# Patient Record
Sex: Female | Born: 2000 | Race: Black or African American | Hispanic: No | Marital: Single | Smoking: Never smoker
Health system: Southern US, Community
[De-identification: ages and names within clinical notes are randomized; demographics above are authoritative.]

## PROBLEM LIST (undated history)

## (undated) DIAGNOSIS — A539 Syphilis, unspecified: Secondary | ICD-10-CM

## (undated) DIAGNOSIS — G43909 Migraine, unspecified, not intractable, without status migrainosus: Secondary | ICD-10-CM

## (undated) HISTORY — DX: Syphilis, unspecified: A53.9

---
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
## 2022-01-02 ENCOUNTER — Other Ambulatory Visit (HOSPITAL_COMMUNITY)
Admission: RE | Admit: 2022-01-02 | Discharge: 2022-01-02 | Disposition: A | Discharge status: Home / Self Care | Payer: Medicaid Other | Source: Ambulatory Visit | Attending: Student | Admitting: Student

## 2022-01-02 ENCOUNTER — Encounter: Payer: Self-pay | Admitting: Student

## 2022-01-02 ENCOUNTER — Ambulatory Visit (INDEPENDENT_AMBULATORY_CARE_PROVIDER_SITE_OTHER): Payer: Medicaid Other | Admitting: Student

## 2022-01-02 VITALS — BP 127/74 | HR 87 | Ht 64.0 in | Wt 168.0 lb

## 2022-01-02 DIAGNOSIS — Z30013 Encounter for initial prescription of injectable contraceptive: Secondary | ICD-10-CM

## 2022-01-02 DIAGNOSIS — Z0000 Encounter for general adult medical examination without abnormal findings: Secondary | ICD-10-CM | POA: Diagnosis present

## 2022-01-02 LAB — POCT URINE PREGNANCY: Preg Test, Ur: NEGATIVE

## 2022-01-02 MED ORDER — MEDROXYPROGESTERONE ACETATE 150 MG/ML IM SUSP
150.0000 mg | Freq: Once | INTRAMUSCULAR | Status: AC
  Administered 2022-01-02: 150 mg via INTRAMUSCULAR

## 2022-01-02 NOTE — Progress Notes (Signed)
?  History:  ?Ms. Chanta Scalera is a 21 y.o. No obstetric history on file. who presents to clinic today to establish care. She reports that her periods are irregular; they last about 5 days. She reports that they are heavy on the 1st and the third day, with occasional blood clots. She also endorses some cramping . She would like to start depo. She is having occasional brown discharge and would like STI testing.  ? ?The following portions of the patient's history were reviewed and updated as appropriate: allergies, current medications, family history, past medical history, social history, past surgical history and problem list. ? ?Review of Systems:  ?Review of Systems  ?Constitutional: Negative.   ?HENT: Negative.    ?Eyes: Negative.   ?Cardiovascular: Negative.   ?Gastrointestinal: Negative.   ?Genitourinary: Negative.   ?     + occasional brown discharge  ?Skin: Negative.   ?Neurological: Negative.   ? ?  ?Objective:  ?Physical Exam ?BP 127/74   Pulse 87   Ht 5\' 4"  (1.626 m)   Wt 168 lb (76.2 kg)   BMI 28.84 kg/m?  ?Physical Exam ?Constitutional:   ?   Appearance: Normal appearance.  ?HENT:  ?   Head: Normocephalic.  ?Cardiovascular:  ?   Rate and Rhythm: Normal rate.  ?Pulmonary:  ?   Effort: Pulmonary effort is normal.  ?Abdominal:  ?   General: Abdomen is flat.  ?Genitourinary: ?   General: Normal vulva.  ?Musculoskeletal:     ?   General: Normal range of motion.  ?Skin: ?   General: Skin is warm.  ?Neurological:  ?   General: No focal deficit present.  ?   Mental Status: She is alert.  ?Psychiatric:     ?   Mood and Affect: Mood normal.  ? ? ? ? ?Labs and Imaging ?No results found for this or any previous visit (from the past 24 hour(s)). ? ?No results found. ? ?Health Maintenance Due  ?Topic Date Due  ? HPV VACCINES (1 - 2-dose series) Never done  ? CHLAMYDIA SCREENING  Never done  ? HIV Screening  Never done  ? Hepatitis C Screening  Never done  ? ? ? ?Assessment & Plan:  ? ?1. Well woman exam (no  gynecological exam)   ?2. Encounter for initial prescription of injectable contraceptive   ? ?-patient wants STI testing and swabs; she was shown how to do self-swabs in the future ?-Phq-9 was a 7; will make referral to [facility] ?Approximately 20 minutes of total time was spent with this patient on counseling and care.  ? ?Starr Lake, CNM ?01/02/2022 ?4:19 PM ? ?

## 2022-01-02 NOTE — Progress Notes (Signed)
NGYN pt in office to establish care. Pt requesting STD testing and she is interested in Depo. Unprotected sex 1 month ago.  ?

## 2022-01-04 ENCOUNTER — Encounter: Payer: Self-pay | Admitting: Student

## 2022-01-04 LAB — HEPATITIS C ANTIBODY: Hep C Virus Ab: NONREACTIVE

## 2022-01-04 LAB — RPR, QUANT+TP ABS (REFLEX)
Rapid Plasma Reagin, Quant: 1:64 {titer} — ABNORMAL HIGH
T Pallidum Abs: REACTIVE — AB

## 2022-01-04 LAB — HIV ANTIBODY (ROUTINE TESTING W REFLEX): HIV Screen 4th Generation wRfx: NONREACTIVE

## 2022-01-04 LAB — HEPATITIS B SURFACE ANTIGEN: Hepatitis B Surface Ag: NEGATIVE

## 2022-01-04 LAB — RPR: RPR Ser Ql: REACTIVE — AB

## 2022-01-06 ENCOUNTER — Telehealth: Payer: Self-pay

## 2022-01-06 LAB — CERVICOVAGINAL ANCILLARY ONLY
Bacterial Vaginitis (gardnerella): POSITIVE — AB
Candida Glabrata: NEGATIVE
Candida Vaginitis: NEGATIVE
Chlamydia: NEGATIVE
Comment: NEGATIVE
Comment: NEGATIVE
Comment: NEGATIVE
Comment: NEGATIVE
Comment: NEGATIVE
Comment: NORMAL
Neisseria Gonorrhea: NEGATIVE
Trichomonas: NEGATIVE

## 2022-01-06 NOTE — Telephone Encounter (Signed)
Called pt in regards to MyChart message.  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  Pt agrees to be scheduled on 01/08/22 @ 1400 for 1st dose.  Pt verbalized understanding.  ? Addison Naegeli, RN  ?01/06/22 ?

## 2022-01-07 ENCOUNTER — Encounter: Payer: Self-pay | Admitting: Student

## 2022-01-07 ENCOUNTER — Telehealth: Payer: Self-pay | Admitting: Lactation Services

## 2022-01-07 DIAGNOSIS — A510 Primary genital syphilis: Secondary | ICD-10-CM | POA: Insufficient documentation

## 2022-01-07 NOTE — Progress Notes (Signed)
Pt was notified. Report faxed to [facility]. Pt scheduled at [facility] at [facility] for Bicillin 2.4 MMUS IM once a week for next 3 weeks.

## 2022-01-07 NOTE — Telephone Encounter (Signed)
-----   Message from Harrel Lemon, RN sent at 01/07/2022 11:00 AM EDT ----- ?Lorain Childes pt just called here trying to reschedule appt for a different time. I gave her address and phone number for [facility]. She says she can't get a live person. I told her to leave a message and try calling back. ?----- Message ----- ?From: Ed Blalock, RN ?Sent: 01/07/2022   9:47 AM EDT ?To: Harrel Lemon, RN ? ?Denny Peon,  ? ?Bicillin 1.2 million Units in each hip weekly x 3. Is there a reason she is scheduled her instead of your office?  ? ?Thanks ?Jasmine December, RN ?----- Message ----- ?From: Harrel Lemon, RN ?Sent: 01/07/2022   8:40 AM EDT ?To: [facility] ? 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 I see that the patient is scheduled 01/08/22 for 1st dose treatment. I can't seem to find orders so that I can write exactly what med and dose and number of treatments on the form. Thanks in advance for checking on this for me! ? ? ? ?

## 2022-01-07 NOTE — Telephone Encounter (Signed)
Called patient back and she reports she works 7-4:30 and needs to come in around 11 since that is her break time. Appointment change to accommodate patient.  ?

## 2022-01-08 ENCOUNTER — Encounter: Payer: Self-pay | Admitting: Student

## 2022-01-08 ENCOUNTER — Ambulatory Visit (INDEPENDENT_AMBULATORY_CARE_PROVIDER_SITE_OTHER): Payer: Medicaid Other

## 2022-01-08 VITALS — BP 137/73 | HR 97 | Wt 164.6 lb

## 2022-01-08 DIAGNOSIS — A539 Syphilis, unspecified: Secondary | ICD-10-CM

## 2022-01-08 MED ORDER — PENICILLIN G BENZATHINE 1200000 UNIT/2ML IM SUSY
2.4000 10*6.[IU] | PREFILLED_SYRINGE | Freq: Once | INTRAMUSCULAR | Status: AC
  Administered 2022-01-08: 2.4 10*6.[IU] via INTRAMUSCULAR

## 2022-01-08 NOTE — Progress Notes (Signed)
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 Patient received first Bicillin dose without issue. Patient aware she is to receive a total of 3 doses of Bicillin once a week for 3 weeks. Patient verbalized understanding and denies any questions.   Alesia Richards, RN 01/08/22

## 2022-01-15 ENCOUNTER — Ambulatory Visit (INDEPENDENT_AMBULATORY_CARE_PROVIDER_SITE_OTHER): Payer: Medicaid Other | Admitting: General Practice

## 2022-01-15 VITALS — BP 138/80 | HR 96 | Ht 64.0 in | Wt 166.0 lb

## 2022-01-15 DIAGNOSIS — A510 Primary genital syphilis: Secondary | ICD-10-CM

## 2022-01-15 MED ORDER — PENICILLIN G BENZATHINE 1200000 UNIT/2ML IM SUSY
2.4000 10*6.[IU] | PREFILLED_SYRINGE | Freq: Once | INTRAMUSCULAR | Status: AC
  Administered 2022-01-15: 2.4 10*6.[IU] via INTRAMUSCULAR

## 2022-01-15 NOTE — Progress Notes (Signed)
[place] here for  Bicillin   Injection.  Injection administered without complication. Patient will return in one week for next injection.  Marylynn Pearson, RN 01/15/2022  10:03 AM

## 2022-01-22 ENCOUNTER — Ambulatory Visit (INDEPENDENT_AMBULATORY_CARE_PROVIDER_SITE_OTHER): Payer: Medicaid Other

## 2022-01-22 DIAGNOSIS — A510 Primary genital syphilis: Secondary | ICD-10-CM

## 2022-01-22 MED ORDER — PENICILLIN G BENZATHINE 1200000 UNIT/2ML IM SUSY
1.2000 10*6.[IU] | PREFILLED_SYRINGE | Freq: Once | INTRAMUSCULAR | Status: AC
  Administered 2022-01-22: 1.2 10*6.[IU] via INTRAMUSCULAR

## 2022-01-22 NOTE — Progress Notes (Signed)
Pt here today for 3rd dose of Bicillin for RPR treatment.  Administered in left/right upper quadrants.  Pt tolerated well.  Pt advised to call [facility] and let them know that she has had her third dose and request there recommendation for f/u.  Pt verbalized understanding with no further questions.    Leonette Nutting  01/22/22

## 2022-02-01 ENCOUNTER — Encounter: Payer: Self-pay | Admitting: Student

## 2022-04-23 ENCOUNTER — Ambulatory Visit (INDEPENDENT_AMBULATORY_CARE_PROVIDER_SITE_OTHER): Payer: Medicaid Other | Admitting: Emergency Medicine

## 2022-04-23 VITALS — BP 124/82 | HR 81 | Ht 64.0 in | Wt 175.0 lb

## 2022-04-23 DIAGNOSIS — Z3042 Encounter for surveillance of injectable contraceptive: Secondary | ICD-10-CM | POA: Diagnosis not present

## 2022-04-23 DIAGNOSIS — A539 Syphilis, unspecified: Secondary | ICD-10-CM

## 2022-04-23 LAB — POCT URINE PREGNANCY: Preg Test, Ur: NEGATIVE

## 2022-04-23 MED ORDER — MEDROXYPROGESTERONE ACETATE 150 MG/ML IM SUSP
150.0000 mg | Freq: Once | INTRAMUSCULAR | Status: AC
  Administered 2022-04-23: 150 mg via INTRAMUSCULAR

## 2022-04-23 MED ORDER — MEDROXYPROGESTERONE ACETATE 150 MG/ML IM SUSP: 150.0000 mg | INTRAMUSCULAR | 0 refills | Status: DC

## 2022-04-23 NOTE — Progress Notes (Signed)
Date last pap: NA. Last Depo-Provera: 01/02/2022. Side Effects if any: Irregular bleeding. Serum HCG indicated? Yes, 20 days outside of window. Per Dr. Alysia Penna, if pregnacy test is negative, pt may receive dose. Depo-Provera 150 mg IM given by: Leavy Cella, RN into LEFT arm, tolerated well. Next appointment due Nov 15-Nov 29.   3 month RPR drawn today.  UPT negative in office, Depo given.

## 2022-04-25 ENCOUNTER — Encounter: Payer: Self-pay | Admitting: Student

## 2022-04-25 LAB — RPR: RPR Ser Ql: REACTIVE — AB

## 2022-04-25 LAB — RPR, QUANT+TP ABS (REFLEX)
Rapid Plasma Reagin, Quant: 1:8 {titer} — ABNORMAL HIGH
T Pallidum Abs: REACTIVE — AB

## 2022-04-29 ENCOUNTER — Encounter: Payer: Self-pay | Admitting: Student

## 2022-05-22 ENCOUNTER — Ambulatory Visit
Admission: EM | Admit: 2022-05-22 | Discharge: 2022-05-22 | Disposition: A | Discharge status: Home / Self Care | Payer: Medicaid Other | Attending: Internal Medicine | Admitting: Internal Medicine

## 2022-05-22 DIAGNOSIS — G43019 Migraine without aura, intractable, without status migrainosus: Secondary | ICD-10-CM

## 2022-05-22 MED ORDER — KETOROLAC TROMETHAMINE 30 MG/ML IJ SOLN
30.0000 mg | Freq: Once | INTRAMUSCULAR | Status: AC
  Administered 2022-05-22: 30 mg via INTRAMUSCULAR

## 2022-05-22 MED ORDER — DEXAMETHASONE SODIUM PHOSPHATE 10 MG/ML IJ SOLN
10.0000 mg | Freq: Once | INTRAMUSCULAR | Status: AC
  Administered 2022-05-22: 10 mg via INTRAMUSCULAR

## 2022-05-22 NOTE — ED Triage Notes (Signed)
Pt c/o frontal headache x2wks. States taking tylenol, Advil, and ibuprofen with no relief. Took tbuprofen 200mg  around noon.

## 2022-05-22 NOTE — ED Provider Notes (Signed)
[facility]    CSN: [id number] Arrival date & time: 05/22/22  1648      History   Chief Complaint Chief Complaint  Patient presents with   Headache    HPI Karen Cherry is a 21 y.o. female.   Patient presents with migraine headache that has been present constantly for about 2 weeks.  Patient reports headache is present in the bilateral sides of the head and in the frontal portion of the head.  Patient states that this feels typical for her migraines in the past.  Patient has taken Tylenol, Advil, ibuprofen with no improvement.  Last took 200 mg of ibuprofen about 6 hours prior to arrival to urgent care.  Denies dizziness, blurred vision, nausea, vomiting.  Denies any recent head traumas or falls.  Patient reports that she has a history of migraines but has not had one in about 4 years.  She was prescribed a "migraine medication" by PCP that she no longer takes.  She is not sure the name of it.  Patient states that her migraines typically resolve with over-the-counter medications but has not this time.   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 No pertinent surgical history.  OB History   No obstetric history on file.      Home Medications    Prior to Admission medications   Medication Sig Start Date End Date Taking? Authorizing Provider  medroxyPROGESTERone (DEPO-PROVERA) 150 MG/ML injection Inject 150 mg into the muscle every 3 (three) months.    [provider]  medroxyPROGESTERone (DEPO-PROVERA) 150 MG/ML injection Inject 1 mL (150 mg total) into the muscle every 3 (three) months. 04/23/22   Chancy Milroy, MD    Family History Family History  Problem Relation Age of Onset   Hypertension Mother    Diabetes Maternal Grandmother    Diabetes Maternal Grandfather     Social History Social History    Tobacco Use   Smoking status: Never  Vaping Use   Vaping Use: Never used  Substance Use Topics   Alcohol use: Not Currently   Drug use: Not Currently     Allergies   Patient has no known allergies.   Review of Systems Review of Systems Per HPI  Physical Exam Triage Vital Signs ED Triage Vitals [05/22/22 1714]  Enc Vitals Group     BP 124/75     Pulse Rate 84     Resp 18     Temp 99.4 F (37.4 C)     Temp Source Oral     SpO2 98 %     Weight      Height      Head Circumference      Peak Flow      Pain Score 7     Pain Loc      Pain Edu?      Excl. in [place]?    No data found.  Updated Vital Signs BP 124/75 (BP Location: Left Arm)   Pulse 84   Temp 99.4 F (37.4 C) (Oral)   Resp 18   LMP  (LMP Unknown) Comment: irregular bleeding  SpO2 98%   Visual Acuity Right Eye Distance:   Left Eye Distance:   Bilateral Distance:    Right Eye Near:   Left Eye Near:  Bilateral Near:     Physical Exam Constitutional:      General: She is not in acute distress.    Appearance: Normal appearance. She is not toxic-appearing or diaphoretic.  HENT:     Head: Normocephalic and atraumatic.  Eyes:     Extraocular Movements: Extraocular movements intact.     Conjunctiva/sclera: Conjunctivae normal.     Pupils: Pupils are equal, round, and reactive to light.  Cardiovascular:     Rate and Rhythm: Normal rate and regular rhythm.     Pulses: Normal pulses.     Heart sounds: Normal heart sounds.  Pulmonary:     Effort: Pulmonary effort is normal. No respiratory distress.     Breath sounds: Normal breath sounds.  Neurological:     General: No focal deficit present.     Mental Status: She is alert and oriented to person, place, and time. Mental status is at baseline.     Cranial Nerves: Cranial nerves 2-12 are intact.     Sensory: Sensation is intact.     Motor: Motor function is intact.     Coordination: Coordination is intact.     Gait: Gait is intact.   Psychiatric:        Mood and Affect: Mood normal.        Behavior: Behavior normal.        Thought Content: Thought content normal.        Judgment: Judgment normal.      UC Treatments / Results  Labs (all labs ordered are listed, but only abnormal results are displayed) Labs Reviewed - No data to display  EKG   Radiology No results found.  Procedures Procedures (including critical care time)  Medications Ordered in UC Medications  ketorolac (TORADOL) 30 MG/ML injection 30 mg (30 mg Intramuscular Given 05/22/22 1752)  dexamethasone (DECADRON) injection 10 mg (10 mg Intramuscular Given 05/22/22 1751)    Initial Impression / Assessment and Plan / UC Course  I have reviewed the triage vital signs and the nursing notes.  Pertinent labs & imaging results that were available during my care of the patient were reviewed by me and considered in my medical decision making (see chart for details).     Patient has migraine headache and states that this feels similar to migraines in the past.  Neuro exam is also normal so do not think that any emergent evaluation or imaging of the head is necessary at this time.  Administered migraine cocktail in urgent care today.  Advised to avoid NSAIDs for at least 24 hours following injections.  Patient advised to go to the emergency department if symptoms persist or worsen the next 24 to 48 hours.  Patient verbalized understanding and was agreeable with plan. Final Clinical Impressions(s) / UC Diagnoses   Final diagnoses:  Intractable migraine without aura and without status migrainosus     Discharge Instructions      You were given migraine cocktail in urgent care today.  Please avoid any ibuprofen, Advil, Aleve for least 24 hours following injections.  Please go to the emergency department if symptoms persist or worsen in 24 to 48 hours.    ED Prescriptions   None    PDMP not reviewed this encounter.   Gustavus Bryant, [place] 05/22/22  [phone]

## 2022-05-22 NOTE — Discharge Instructions (Signed)
You were given migraine cocktail in urgent care today.  Please avoid any ibuprofen, Advil, Aleve for least 24 hours following injections.  Please go to the emergency department if symptoms persist or worsen in 24 to 48 hours.

## 2022-07-11 ENCOUNTER — Ambulatory Visit (INDEPENDENT_AMBULATORY_CARE_PROVIDER_SITE_OTHER): Payer: Medicaid Other | Admitting: General Practice

## 2022-07-11 VITALS — BP 124/82 | HR 87 | Ht 64.0 in | Wt 180.7 lb

## 2022-07-11 DIAGNOSIS — Z3042 Encounter for surveillance of injectable contraceptive: Secondary | ICD-10-CM | POA: Diagnosis not present

## 2022-07-11 MED ORDER — MEDROXYPROGESTERONE ACETATE 150 MG/ML IM SUSP: 150.0000 mg | INTRAMUSCULAR | 1 refills | Status: DC

## 2022-07-11 MED ORDER — MEDROXYPROGESTERONE ACETATE 150 MG/ML IM SUSP
150.0000 mg | Freq: Once | INTRAMUSCULAR | Status: AC
  Administered 2022-07-11: 150 mg via INTRAMUSCULAR

## 2022-07-11 NOTE — Progress Notes (Signed)
Patient was assessed and managed by nursing staff during this encounter. I have reviewed the chart and agree with the documentation and plan. I have also made any necessary editorial changes.  Catalina Antigua, MD 07/11/2022 11:53 AM

## 2022-07-11 NOTE — Progress Notes (Signed)
Date last pap: NA. Last Depo-Provera: 04-23-22. Side Effects if any: Pt tolerated well. Serum HCG indicated? Depo given on schedule. Depo-Provera 150 mg IM given by: Hope Pigeon, CMA in the LD per pt request. Next appointment due 2/2-2/16.

## 2022-09-29 ENCOUNTER — Ambulatory Visit (INDEPENDENT_AMBULATORY_CARE_PROVIDER_SITE_OTHER): Payer: Medicaid Other

## 2022-09-29 VITALS — BP 125/76 | HR 90 | Wt 186.0 lb

## 2022-09-29 DIAGNOSIS — Z3042 Encounter for surveillance of injectable contraceptive: Secondary | ICD-10-CM | POA: Diagnosis not present

## 2022-09-29 MED ORDER — MEDROXYPROGESTERONE ACETATE 150 MG/ML IM SUSP
150.0000 mg | Freq: Once | INTRAMUSCULAR | Status: AC
  Administered 2022-09-29: 150 mg via INTRAMUSCULAR

## 2022-09-29 NOTE — Progress Notes (Signed)
Subjective:  Pt in for Depo Provera injection. Used pt's supply   Objective: Need for contraception. No unusual complaints.    Assessment: Depo given L Del without difficulty   Plan:  Next injection due Apr 23-May 7.

## 2022-12-16 ENCOUNTER — Ambulatory Visit (INDEPENDENT_AMBULATORY_CARE_PROVIDER_SITE_OTHER): Payer: Medicaid Other | Admitting: *Deleted

## 2022-12-16 VITALS — BP 127/81 | HR 91

## 2022-12-16 DIAGNOSIS — Z3042 Encounter for surveillance of injectable contraceptive: Secondary | ICD-10-CM

## 2022-12-16 MED ORDER — MEDROXYPROGESTERONE ACETATE 150 MG/ML IM SUSP: 150.0000 mg | INTRAMUSCULAR | 0 refills | Status: DC

## 2022-12-16 MED ORDER — MEDROXYPROGESTERONE ACETATE 150 MG/ML IM SUSP
150.0000 mg | Freq: Once | INTRAMUSCULAR | Status: AC
  Administered 2022-12-16: 150 mg via INTRAMUSCULAR

## 2022-12-16 NOTE — Progress Notes (Addendum)
Date last pap: NA/ Annual due after 01/03/23 Last Depo-Provera: 09/29/22. Side Effects if any: NA. Serum HCG indicated? : NA. Depo-Provera 150 mg IM given by: Montez Morita, RN in . Next appointment due 03/03/23-03/17/23.  Pt advised 1 refill to be sent today (annual RX was not sent on 01/02/22). Annual scheduled at check out.

## 2023-02-02 ENCOUNTER — Encounter: Payer: Self-pay | Admitting: Obstetrics and Gynecology

## 2023-02-02 ENCOUNTER — Ambulatory Visit (INDEPENDENT_AMBULATORY_CARE_PROVIDER_SITE_OTHER): Payer: Medicaid Other | Admitting: Obstetrics and Gynecology

## 2023-02-02 VITALS — BP 133/75 | HR 85 | Ht 65.0 in | Wt 193.8 lb

## 2023-02-02 DIAGNOSIS — Z3042 Encounter for surveillance of injectable contraceptive: Secondary | ICD-10-CM

## 2023-02-02 DIAGNOSIS — Z0000 Encounter for general adult medical examination without abnormal findings: Secondary | ICD-10-CM | POA: Diagnosis not present

## 2023-02-02 MED ORDER — MEDROXYPROGESTERONE ACETATE 150 MG/ML IM SUSP: 150.0000 mg | INTRAMUSCULAR | 3 refills | Status: DC

## 2023-02-02 NOTE — Patient Instructions (Signed)
[email or web address]

## 2023-02-02 NOTE — Progress Notes (Signed)
Pt presents for AEX. Pt requesting refill of Depo.

## 2023-02-02 NOTE — Progress Notes (Signed)
ANNUAL EXAM Patient name: Karen Cherry MRN [id number]  Date of birth: [birth year] Chief Complaint:   No chief complaint on file.  History of Present Illness:   Karen Cherry is a 22 y.o. No obstetric history on file.  female being seen today for a routine annual exam.  Current complaints: weight gain with depo  No LMP recorded. Patient has had an injection.   Upstream - 02/02/23 1316       Pregnancy Intention Screening   Does the patient want to become pregnant in the next year? No    Does the patient's partner want to become pregnant in the next year? No    Would the patient like to discuss contraceptive options today? Yes            The pregnancy intention screening data noted above was reviewed. Potential methods of contraception were discussed. The patient elected to proceed with No data recorded.   Last pap n/a. Results were: N/A. Not of age Last mammogram: n/a. Results were: N/A. Family h/o breast cancer: no Last colonoscopy: n/a. Results were: N/A. Family h/o colorectal cancer: no     02/02/2023    1:14 PM 01/02/2022    3:53 PM  Depression screen PHQ 2/9  Decreased Interest 0 0  Down, Depressed, Hopeless 1 1  PHQ - 2 Score 1 1  Altered sleeping 0 0  Tired, decreased energy 1 0  Change in appetite 3 3  Feeling bad or failure about yourself  2 3  Trouble concentrating 0 0  Moving slowly or fidgety/restless 0 0  Suicidal thoughts 0 0  PHQ-9 Score 7 7  Difficult doing work/chores  Somewhat difficult        02/02/2023    1:15 PM 01/02/2022    3:54 PM  GAD 7 : Generalized Anxiety Score  Nervous, Anxious, on Edge 2 0  Control/stop worrying 0 0  Worry too much - different things 1 2  Trouble relaxing 0 0  Restless 0 0  Easily annoyed or irritable 3 3  Afraid - awful might happen 0 0  Total GAD 7 Score 6 5  Anxiety Difficulty  Somewhat difficult     Review of Systems:   Pertinent items are noted in HPI Denies any headaches, blurred vision, fatigue,  shortness of breath, chest pain, abdominal pain, abnormal vaginal discharge/itching/odor/irritation, problems with periods, bowel movements, urination, or intercourse unless otherwise stated above. Pertinent History Reviewed:  Reviewed past medical,surgical, social and family history.  Reviewed problem list, medications and allergies. Physical Assessment:   Vitals:   02/02/23 1308  BP: 133/75  Pulse: 85  Weight: 193 lb 12.8 oz (87.9 kg)  Height: 5\' 5"  (1.651 m)  Body mass index is 32.25 kg/m.        Physical Examination:   General appearance - well appearing, and in no distress  Mental status - alert, oriented to person, place, and time  Psych:  She has a normal mood and affect  Skin - warm and dry, normal color, no suspicious lesions noted  Chest - effort normal, all lung fields clear to auscultation bilaterally  Heart - normal rate and regular rhythm  Neck:  midline trachea, no thyromegaly or nodules  Breasts - breasts appear normal, no suspicious masses, no skin or nipple changes or  axillary nodes  Abdomen - soft, nontender, nondistended, no masses or organomegaly  Pelvic - deferred  Extremities:  No swelling or varicosities noted  Chaperone present for exam  No results  found for this or any previous visit (from the past 24 hour(s)).  Assessment & Plan:  1. Encounter for surveillance of injectable contraceptive Would like to continue depo for now, rx sent follow up in July   2. Well woman exam without gynecological exam Pap next year, discussed other options of BC, will let us know if she decides to change     Mammogram: @ 20yo, or sooner if problems Colonoscopy: @ 20yo, or sooner if problems   Follow-up: One year annual well woman exam, 7/15 for depo provera injection  Future Appointments  Date Time Provider Department Center  03/09/2023  1:20 PM [facility] NURSE [facility] None    Albertine Grates, FNP

## 2023-03-09 ENCOUNTER — Ambulatory Visit: Payer: Medicaid Other | Admitting: *Deleted

## 2023-03-09 VITALS — BP 121/73 | HR 84

## 2023-03-09 DIAGNOSIS — Z3042 Encounter for surveillance of injectable contraceptive: Secondary | ICD-10-CM | POA: Diagnosis not present

## 2023-03-09 MED ORDER — MEDROXYPROGESTERONE ACETATE 150 MG/ML IM SUSP
150.0000 mg | Freq: Once | INTRAMUSCULAR | Status: AC
  Administered 2023-03-09: 150 mg via INTRAMUSCULAR

## 2023-03-09 NOTE — Progress Notes (Signed)
Date last pap: NA (Annual 02/02/23. Last Depo-Provera: 12/16/22. Side Effects if any: Weight gain. Serum HCG indicated? NA. Depo-Provera 150 mg IM given by: Montez Morita, RN. Next appointment due 05/25/23-06/08/23.

## 2023-06-04 ENCOUNTER — Ambulatory Visit: Payer: Medicaid Other

## 2023-06-04 VITALS — BP 127/79 | HR 93 | Wt 201.2 lb

## 2023-06-04 DIAGNOSIS — Z3042 Encounter for surveillance of injectable contraceptive: Secondary | ICD-10-CM

## 2023-06-04 MED ORDER — MEDROXYPROGESTERONE ACETATE 150 MG/ML IM SUSP
150.0000 mg | INTRAMUSCULAR | Status: DC
  Administered 2023-06-04: 150 mg via INTRAMUSCULAR

## 2023-06-04 NOTE — Progress Notes (Signed)
Pt is in the office for depo injection. Administered in L Del, per pt request, and pt tolerated well. Next due Dec 26- Jan 9 .. Administrations This Visit     medroxyPROGESTERone (DEPO-PROVERA) injection 150 mg     Admin Date 06/04/2023 Action Given Dose 150 mg Route Intramuscular Documented By Katrina Stack, RN

## 2023-07-07 ENCOUNTER — Ambulatory Visit
Admission: EM | Admit: 2023-07-07 | Discharge: 2023-07-07 | Disposition: A | Discharge status: Home / Self Care | Payer: Medicaid Other | Attending: Internal Medicine | Admitting: Internal Medicine

## 2023-07-07 DIAGNOSIS — J208 Acute bronchitis due to other specified organisms: Secondary | ICD-10-CM | POA: Diagnosis not present

## 2023-07-07 DIAGNOSIS — J029 Acute pharyngitis, unspecified: Secondary | ICD-10-CM

## 2023-07-07 LAB — POCT RAPID STREP A (OFFICE): Rapid Strep A Screen: NEGATIVE

## 2023-07-07 MED ORDER — BENZONATATE 100 MG PO CAPS: 100.0000 mg | ORAL_CAPSULE | Freq: Three times a day (TID) | ORAL | 0 refills | Status: AC | PRN

## 2023-07-07 MED ORDER — PREDNISONE 20 MG PO TABS: 40.0000 mg | ORAL_TABLET | Freq: Every day | ORAL | 0 refills | Status: AC

## 2023-07-07 NOTE — Discharge Instructions (Signed)
I have prescribed you a steroid and a cough medication due to acute viral illness.  Follow-up if any symptoms persist or worsen.

## 2023-07-07 NOTE — ED Triage Notes (Signed)
Patient presents with sore throat, cough, headache and states chest hurts from coughing x day 6. Treated with Mucinex without relief.

## 2023-07-07 NOTE — ED Provider Notes (Signed)
[facility]    CSN: [id number] Arrival date & time: 07/07/23  1557      History   Chief Complaint No chief complaint on file.   HPI Karen Cherry is a 22 y.o. female.   Patient presents with approximately 6-day history of sore throat, nasal congestion, cough.  Reports that she has chest discomfort when she coughs.  Denies any shortness of breath.  Denies any fever but does report body aches and chills.  Has taken Mucinex with no improvement.  Denies any known sick contacts.  Denies history of asthma.     Past Medical History:  Diagnosis Date   Migraines    Syphilis     There are no problems to display for this patient.   History reviewed. No pertinent surgical history.  OB History     Gravida  0   Para  0   Term  0   Preterm  0   AB  0   Living  0      SAB  0   IAB  0   Ectopic  0   Multiple  0   Live Births  0            Home Medications    Prior to Admission medications   Medication Sig Start Date End Date Taking? Authorizing Provider  benzonatate (TESSALON) 100 MG capsule Take 1 capsule (100 mg total) by mouth every 8 (eight) hours as needed for cough. 07/07/23  Yes Griffey Nicasio, Rolly Salter E, FNP  predniSONE (DELTASONE) 20 MG tablet Take 2 tablets (40 mg total) by mouth daily for 5 days. 07/07/23 07/12/23 Yes Cheng Dec, Acie Fredrickson, FNP  medroxyPROGESTERone (DEPO-PROVERA) 150 MG/ML injection Inject 150 mg into the muscle every 3 (three) months.    [provider]  medroxyPROGESTERone (DEPO-PROVERA) 150 MG/ML injection Inject 1 mL (150 mg total) into the muscle every 3 (three) months. 12/16/22   Warden Fillers, MD  medroxyPROGESTERone (DEPO-PROVERA) 150 MG/ML injection Inject 1 mL (150 mg total) into the muscle every 3 (three) months. 02/02/23   Sue Lush, FNP    Family History Family History  Problem Relation Age of Onset   Hypertension Mother    Diabetes Maternal Grandmother    Diabetes Maternal Grandfather     Social  History Social History   Tobacco Use   Smoking status: Never    Passive exposure: Never  Vaping Use   Vaping status: Never Used  Substance Use Topics   Alcohol use: Not Currently   Drug use: Not Currently     Allergies   Patient has no known allergies.   Review of Systems Review of Systems Per HPI  Physical Exam Triage Vital Signs ED Triage Vitals  Encounter Vitals Group     BP 07/07/23 1633 132/82     Systolic BP Percentile --      Diastolic BP Percentile --      Pulse Rate 07/07/23 1633 83     Resp 07/07/23 1633 20     Temp 07/07/23 1633 98.6 F (37 C)     Temp Source 07/07/23 1633 Oral     SpO2 07/07/23 1633 100 %     Weight 07/07/23 1631 205 lb (93 kg)     Height 07/07/23 1631 5\' 4"  (1.626 m)     Head Circumference --      Peak Flow --      Pain Score 07/07/23 1631 6     Pain Loc --  Pain Education --      Exclude from Growth Chart --    No data found.  Updated Vital Signs BP 132/82 (BP Location: Left Arm)   Pulse 83   Temp 98.6 F (37 C) (Oral)   Resp 20   Ht 5\' 4"  (1.626 m)   Wt 205 lb (93 kg)   SpO2 100%   BMI 35.19 kg/m   Visual Acuity Right Eye Distance:   Left Eye Distance:   Bilateral Distance:    Right Eye Near:   Left Eye Near:    Bilateral Near:     Physical Exam Constitutional:      General: She is not in acute distress.    Appearance: Normal appearance. She is not toxic-appearing or diaphoretic.  HENT:     Head: Normocephalic and atraumatic.     Right Ear: Tympanic membrane and ear canal normal.     Left Ear: Tympanic membrane and ear canal normal.     Nose: Congestion present.     Mouth/Throat:     Mouth: Mucous membranes are moist.     Pharynx: Posterior oropharyngeal erythema present.  Eyes:     Extraocular Movements: Extraocular movements intact.     Conjunctiva/sclera: Conjunctivae normal.     Pupils: Pupils are equal, round, and reactive to light.  Cardiovascular:     Rate and Rhythm: Normal rate and regular  rhythm.     Pulses: Normal pulses.     Heart sounds: Normal heart sounds.  Pulmonary:     Effort: Pulmonary effort is normal. No respiratory distress.     Breath sounds: Normal breath sounds. No stridor. No wheezing, rhonchi or rales.  Abdominal:     General: Abdomen is flat. Bowel sounds are normal.     Palpations: Abdomen is soft.  Musculoskeletal:        General: Normal range of motion.     Cervical back: Normal range of motion.  Skin:    General: Skin is warm and dry.  Neurological:     General: No focal deficit present.     Mental Status: She is alert and oriented to person, place, and time. Mental status is at baseline.  Psychiatric:        Mood and Affect: Mood normal.        Behavior: Behavior normal.      UC Treatments / Results  Labs (all labs ordered are listed, but only abnormal results are displayed) Labs Reviewed  CULTURE, GROUP A STREP [facility])  POCT RAPID STREP A (OFFICE)    EKG   Radiology No results found.  Procedures Procedures (including critical care time)  Medications Ordered in UC Medications - No data to display  Initial Impression / Assessment and Plan / UC Course  I have reviewed the triage vital signs and the nursing notes.  Pertinent labs & imaging results that were available during my care of the patient were reviewed by me and considered in my medical decision making (see chart for details).     Suspect possible acute viral bronchitis.  There are no adventitious lung sounds on exam so do not think that chest imaging is necessary.  Rapid strep is negative.  Throat culture pending.  Patient declined COVID testing.  Given harsh coughing, will treat with prednisone steroid and benzonatate for cough.  Advised strict follow up precautions if any symptoms persist or worsen.  Patient verbalized understanding and was agreeable with plan. Final Clinical Impressions(s) / UC Diagnoses   Final  diagnoses:  Acute viral bronchitis  Sore throat      Discharge Instructions      I have prescribed you a steroid and a cough medication due to acute viral illness.  Follow-up if any symptoms persist or worsen.    ED Prescriptions     Medication Sig Dispense Auth. Provider   predniSONE (DELTASONE) 20 MG tablet Take 2 tablets (40 mg total) by mouth daily for 5 days. 10 tablet [place], [place] E, [place]   benzonatate (TESSALON) 100 MG capsule Take 1 capsule (100 mg total) by mouth every 8 (eight) hours as needed for cough. 21 capsule [place], Acie Fredrickson, [place]      PDMP not reviewed this encounter.   Gustavus Bryant, [place] 07/07/23 [phone]

## 2023-07-10 LAB — CULTURE, GROUP A STREP (THRC)

## 2023-08-24 ENCOUNTER — Ambulatory Visit (INDEPENDENT_AMBULATORY_CARE_PROVIDER_SITE_OTHER): Payer: Medicaid Other

## 2023-08-24 DIAGNOSIS — Z3042 Encounter for surveillance of injectable contraceptive: Secondary | ICD-10-CM | POA: Diagnosis not present

## 2023-08-24 MED ORDER — MEDROXYPROGESTERONE ACETATE 150 MG/ML IM SUSP
150.0000 mg | Freq: Once | INTRAMUSCULAR | Status: AC
  Administered 2023-08-24: 150 mg via INTRAMUSCULAR

## 2023-08-24 NOTE — Progress Notes (Signed)
Date last pap: NA . Last Depo-Provera: 06/04/23. Side Effects if any: NA. Serum HCG indicated? NA. Depo-Provera 150 mg IM given by: Madilyne Tadlock,CMA . Next appointment due 11/09/23-11/23/23.

## 2023-10-01 ENCOUNTER — Ambulatory Visit: Payer: Medicaid Other

## 2023-10-01 VITALS — BP 144/79 | HR 98 | Wt 213.0 lb

## 2023-10-01 DIAGNOSIS — Z3202 Encounter for pregnancy test, result negative: Secondary | ICD-10-CM | POA: Diagnosis not present

## 2023-10-01 DIAGNOSIS — Z30017 Encounter for initial prescription of implantable subdermal contraceptive: Secondary | ICD-10-CM

## 2023-10-01 LAB — POCT URINE PREGNANCY: Preg Test, Ur: NEGATIVE

## 2023-10-01 MED ORDER — ETONOGESTREL 68 MG ~~LOC~~ IMPL
68.0000 mg | DRUG_IMPLANT | Freq: Once | SUBCUTANEOUS | Status: AC
  Administered 2023-10-01: 68 mg via SUBCUTANEOUS

## 2023-10-01 NOTE — Addendum Note (Signed)
 Addended by: Kraig Peru on: 10/01/2023 03:43 PM   Modules accepted: Orders, Level of Service

## 2023-10-01 NOTE — Progress Notes (Signed)
 Pt presents for nexplanon  insertion. Pt previously on depo for one year, but did not like the side effects. UPT negative. Not currently sexually active.

## 2023-10-01 NOTE — Progress Notes (Signed)
 OFFICE PROCEDURE NOTE  History:  Karen Cherry is a 23 y.o. [id number] here today for Nexplanon  insertion. She was on Depo Provera  and last dose was 06/04/2023.  She denies bleeding, but reports weight gain.  She is not currently sexually active, but has been in the past.   Past Medical History:  Diagnosis Date   Migraines    Syphilis     History reviewed. No pertinent surgical history.  The following portions of the patient's history were reviewed and updated as appropriate: allergies, current medications, past family history, past medical history, past social history, past surgical history and problem list.   Health Maintenance:  No pap or mammogram on file d/t age.   Review of Systems:  Review of Systems  All other systems reviewed and are negative.   Objective:  Vitals: BP (!) 144/79   Pulse 98   Wt 213 lb (96.6 kg)   LMP  (LMP Unknown)   BMI 36.56 kg/m   Physical Exam Vitals reviewed. Exam conducted with a chaperone present [name], CHARITY FUNDRAISER).  Constitutional:      Appearance: Normal appearance.  HENT:     Head: Normocephalic and atraumatic.  Eyes:     Conjunctiva/sclera: Conjunctivae normal.  Pulmonary:     Effort: Pulmonary effort is normal. No respiratory distress.  Musculoskeletal:     Cervical back: Normal range of motion.  Skin:    General: Skin is warm and dry.  Neurological:     Mental Status: She is alert and oriented to person, place, and time.  Psychiatric:        Behavior: Behavior normal.     Procedure   Nexplanon  Insertion Procedure: SN: [id number] EXP: 09/2025 LOT: [id number]  Viviano Louder requests insertion of Nexplanon  for contraception method.  She understands the risks associated with insertion including pain, bleeding, infection, and paresthesias of the arm.  Patient also understands that Nexplanon  can cause change in bleeding.  However, patient accepts and understands all these risks and desires to proceed.  Nexplanon  inserted as  below.  Informed consent signed.   Appropriate time out taken.  Patient's non-dominant left arm was identified prepped and draped in the usual sterile fashion. The area was identified ~10  cm from epicondyle and 3 cm posterior to the sulcus between the biceps and tricep muscles. The area was prepped with alcohol swab and then injected with 2.5mL of 1% lidocaine along the anticipated insertion route.  The area was then prepped with betadine and allowed 60 seconds before being wiped away with sterile gauze. Nexplanon  was removed from packaging and device was confirmed within needle by provider visualization.   The device was then inserted per manufacturers instruction without complications.  The device was then palpated in the patient's arm by patient and provider. The insertion area was hemostatic with pressure and covered with steri strip and band-aid.  The arm was then wrapped with pressure dressing.   The patient tolerated the procedure well and was given post procedure instructions to include: *Remove of pressure dressing and band-aid in 12-24 hours. *Remove of steri-strips after no more than 7 days. *Condom usage encouraged for the first 7 days to avoid pregnancy and always to avoid STDs. *Tylenol  or ibuprofen  for insertion pain/discomfort. *Call for any other questions, concerns, or complications.   Assessment & Plan:  23  year old Nexplanon  Insertion   -Insertion as above. -Discussed sending mychart message or calling office if concerns with bleeding. -RTO in 6-8 months for annual exam with initial  pap or prn.     Harlene LITTIE Duncans MSN, CNM 10/01/2023

## 2024-05-02 ENCOUNTER — Ambulatory Visit: Admitting: Obstetrics and Gynecology

## 2024-05-10 ENCOUNTER — Ambulatory Visit (INDEPENDENT_AMBULATORY_CARE_PROVIDER_SITE_OTHER): Admitting: Obstetrics and Gynecology

## 2024-05-10 ENCOUNTER — Other Ambulatory Visit (HOSPITAL_COMMUNITY)
Admission: RE | Admit: 2024-05-10 | Discharge: 2024-05-10 | Disposition: A | Discharge status: Home / Self Care | Source: Ambulatory Visit | Attending: Obstetrics and Gynecology | Admitting: Obstetrics and Gynecology

## 2024-05-10 ENCOUNTER — Encounter: Payer: Self-pay | Admitting: Obstetrics and Gynecology

## 2024-05-10 VITALS — BP 136/85 | HR 80 | Ht 65.0 in | Wt 221.0 lb

## 2024-05-10 DIAGNOSIS — Z01419 Encounter for gynecological examination (general) (routine) without abnormal findings: Secondary | ICD-10-CM | POA: Diagnosis present

## 2024-05-10 DIAGNOSIS — Z113 Encounter for screening for infections with a predominantly sexual mode of transmission: Secondary | ICD-10-CM | POA: Diagnosis not present

## 2024-05-10 DIAGNOSIS — Z124 Encounter for screening for malignant neoplasm of cervix: Secondary | ICD-10-CM

## 2024-05-10 NOTE — Progress Notes (Signed)
   Subjective:     Karen Cherry is a 23 y.o. female here at [facility] for a routine exam.  Current complaints: desires STD screening. Denies vaginal discharge, odor, or irritation.  Personal and family health history reviewed: yes.  Do you have a primary care provider? Yes [facility] Do you feel safe at home? Yes  Flowsheet Row Procedure visit from 10/01/2023 in [facility] for [facility] Total Score 0    Health Maintenance Due  Topic Date Due   HPV VACCINES (1 - 3-dose series) Never done   Meningococcal B Vaccine (1 of 2 - Standard) Never done   DTaP/Tdap/Td (1 - Tdap) Never done   CHLAMYDIA SCREENING  01/03/2023   Cervical Cancer Screening (Pap smear)  Never done   Influenza Vaccine  03/25/2024   COVID-19 Vaccine (1 - 2024-25 season) Never done     Risk factors for chronic health problems: None Smoking: None Alchohol/how much:None Pt BMI: Body mass index is 36.78 kg/m.   Gynecologic History Patient's last menstrual period was 03/19/2024 (approximate). Contraception: Nexplanon  Last Pap: N/A. Results were: N/A Last mammogram: N/A. Results were: N/A  Obstetric History OB History  Gravida Para Term Preterm AB Living  0 0 0 0 0 0  SAB IAB Ectopic Multiple Live Births  0 0 0 0 0     The following portions of the patient's history were reviewed and updated as appropriate: allergies, current medications, past family history, past medical history, past social history, past surgical history, and problem list.  Review of Systems Pertinent items noted in HPI and remainder of comprehensive ROS otherwise negative.    Objective:   Today's Vitals   05/10/24 0824  BP: 136/85  Pulse: 80  Weight: 100.2 kg  Height: 5' 5 (1.651 m)  PainSc: 0-No pain   Body mass index is 36.78 kg/m.  VS reviewed, nursing note reviewed,  Constitutional: well developed, well nourished, no distress HEENT: normocephalic, thyroid without enlargement or mass HEART: RRR,  no murmurs rubs/gallops RESP: clear and equal to auscultation bilaterally in all lobes  Breast Exam:  exam performed: right breast normal without mass, skin or nipple changes or axillary nodes, left breast normal without mass, skin or nipple changes or axillary nodes Abdomen: soft Neuro: alert and oriented x 3 Skin: warm, dry Psych: affect normal Pelvic exam: Performed: Cervix pink, visually closed, without lesion, scant white creamy discharge, vaginal walls and external genitalia normal Bimanual exam: firm, anterior, neg CMT, uterus nontender, nonenlarged, adnexa without tenderness, enlargement, or mass        Assessment/Plan:  1. Well woman exam (Primary) - Cervicovaginal ancillary only( [place]) - RPR - Hepatitis C Antibody - Hepatitis B surface antigen - HIV antibody (with reflex)  2. Cervical cancer screening - Cytology - PAP( [place])  3. Screening examination for STD (sexually transmitted disease) - Cervicovaginal ancillary only( [place]) - RPR - Hepatitis C Antibody - Hepatitis B surface antigen - HIV antibody (with reflex)     Return in about 1 year (around 05/10/2025) for Annual or sooner if needed.   Derrek JINNY Freund, NP Student 8:44 AM

## 2024-05-10 NOTE — Progress Notes (Signed)
 Has Nexplanon  in place since 09/2023. Wants STI testing today. PHQ 6 GAD 4. Denies concerns today.

## 2024-05-11 LAB — CERVICOVAGINAL ANCILLARY ONLY
Chlamydia: NEGATIVE
Comment: NEGATIVE
Comment: NEGATIVE
Comment: NORMAL
Neisseria Gonorrhea: NEGATIVE
Trichomonas: NEGATIVE

## 2024-05-12 ENCOUNTER — Ambulatory Visit: Payer: Self-pay | Admitting: Obstetrics and Gynecology

## 2024-05-12 LAB — RPR, QUANT+TP ABS (REFLEX)
Rapid Plasma Reagin, Quant: 1:4 {titer} — ABNORMAL HIGH
T Pallidum Abs: REACTIVE — AB

## 2024-05-12 LAB — HEPATITIS B SURFACE ANTIGEN: Hepatitis B Surface Ag: NEGATIVE

## 2024-05-12 LAB — CYTOLOGY - PAP: Diagnosis: NEGATIVE

## 2024-05-12 LAB — HEPATITIS C ANTIBODY: Hep C Virus Ab: NONREACTIVE

## 2024-05-12 LAB — RPR: RPR Ser Ql: REACTIVE — AB

## 2024-05-12 LAB — HIV ANTIBODY (ROUTINE TESTING W REFLEX): HIV Screen 4th Generation wRfx: NONREACTIVE
